# Patient Record
Sex: Female | Born: 1960 | Race: White | Hispanic: No | Marital: Married | Smoking: Never smoker
Health system: Southern US, Community
[De-identification: ages and names within clinical notes are randomized; demographics above are authoritative.]

## PROBLEM LIST (undated history)

## (undated) DIAGNOSIS — E785 Hyperlipidemia, unspecified: Secondary | ICD-10-CM

## (undated) DIAGNOSIS — Z860101 Personal history of adenomatous and serrated colon polyps: Secondary | ICD-10-CM

## (undated) DIAGNOSIS — C801 Malignant (primary) neoplasm, unspecified: Secondary | ICD-10-CM

## (undated) DIAGNOSIS — Z86010 Personal history of colonic polyps: Secondary | ICD-10-CM

## (undated) DIAGNOSIS — I8390 Asymptomatic varicose veins of unspecified lower extremity: Secondary | ICD-10-CM

## (undated) DIAGNOSIS — N189 Chronic kidney disease, unspecified: Secondary | ICD-10-CM

## (undated) DIAGNOSIS — I493 Ventricular premature depolarization: Secondary | ICD-10-CM

## (undated) HISTORY — DX: Ventricular premature depolarization: I49.3

## (undated) HISTORY — DX: Chronic kidney disease, unspecified: N18.9

## (undated) HISTORY — PX: BREAST BIOPSY: SHX20

## (undated) HISTORY — DX: Malignant (primary) neoplasm, unspecified: C80.1

## (undated) HISTORY — PX: TONSILLECTOMY: SUR1361

## (undated) HISTORY — DX: Personal history of colonic polyps: Z86.010

## (undated) HISTORY — PX: COLONOSCOPY W/ POLYPECTOMY: SHX1380

## (undated) HISTORY — DX: Personal history of adenomatous and serrated colon polyps: Z86.0101

## (undated) HISTORY — DX: Asymptomatic varicose veins of unspecified lower extremity: I83.90

## (undated) HISTORY — DX: Hyperlipidemia, unspecified: E78.5

---

## 1997-10-18 ENCOUNTER — Other Ambulatory Visit: Admission: RE | Admit: 1997-10-18 | Discharge: 1997-10-18 | Payer: Self-pay | Admitting: Obstetrics & Gynecology

## 1998-10-19 ENCOUNTER — Other Ambulatory Visit: Admission: RE | Admit: 1998-10-19 | Discharge: 1998-10-19 | Payer: Self-pay | Admitting: Obstetrics & Gynecology

## 2000-03-14 ENCOUNTER — Other Ambulatory Visit: Admission: RE | Admit: 2000-03-14 | Discharge: 2000-03-14 | Payer: Self-pay | Admitting: Obstetrics & Gynecology

## 2001-03-11 ENCOUNTER — Other Ambulatory Visit: Admission: RE | Admit: 2001-03-11 | Discharge: 2001-03-11 | Payer: Self-pay | Admitting: Obstetrics & Gynecology

## 2003-05-26 ENCOUNTER — Other Ambulatory Visit: Admission: RE | Admit: 2003-05-26 | Discharge: 2003-05-26 | Payer: Self-pay | Admitting: Obstetrics & Gynecology

## 2004-05-26 ENCOUNTER — Other Ambulatory Visit: Admission: RE | Admit: 2004-05-26 | Discharge: 2004-05-26 | Payer: Self-pay | Admitting: Obstetrics & Gynecology

## 2005-08-10 ENCOUNTER — Other Ambulatory Visit: Admission: RE | Admit: 2005-08-10 | Discharge: 2005-08-10 | Payer: Self-pay | Admitting: Obstetrics & Gynecology

## 2009-06-22 ENCOUNTER — Encounter: Payer: Self-pay | Admitting: Internal Medicine

## 2009-06-27 ENCOUNTER — Encounter: Payer: Self-pay | Admitting: Internal Medicine

## 2009-06-29 ENCOUNTER — Ambulatory Visit: Payer: Self-pay | Admitting: Internal Medicine

## 2009-06-29 DIAGNOSIS — K589 Irritable bowel syndrome without diarrhea: Secondary | ICD-10-CM | POA: Insufficient documentation

## 2009-06-29 DIAGNOSIS — R0789 Other chest pain: Secondary | ICD-10-CM | POA: Insufficient documentation

## 2009-06-29 DIAGNOSIS — R519 Headache, unspecified: Secondary | ICD-10-CM | POA: Insufficient documentation

## 2009-06-29 DIAGNOSIS — I498 Other specified cardiac arrhythmias: Secondary | ICD-10-CM | POA: Insufficient documentation

## 2009-06-29 DIAGNOSIS — N926 Irregular menstruation, unspecified: Secondary | ICD-10-CM

## 2009-06-29 DIAGNOSIS — R002 Palpitations: Secondary | ICD-10-CM | POA: Insufficient documentation

## 2009-06-29 DIAGNOSIS — R42 Dizziness and giddiness: Secondary | ICD-10-CM | POA: Insufficient documentation

## 2009-06-29 DIAGNOSIS — R51 Headache: Secondary | ICD-10-CM | POA: Insufficient documentation

## 2009-07-11 ENCOUNTER — Ambulatory Visit: Payer: Self-pay | Admitting: Internal Medicine

## 2009-07-27 ENCOUNTER — Ambulatory Visit: Payer: Self-pay

## 2009-07-27 ENCOUNTER — Ambulatory Visit: Payer: Self-pay | Admitting: Internal Medicine

## 2009-07-27 ENCOUNTER — Ambulatory Visit: Payer: Self-pay | Admitting: Cardiovascular Disease

## 2009-08-10 ENCOUNTER — Ambulatory Visit: Payer: Self-pay | Admitting: Cardiology

## 2009-08-10 ENCOUNTER — Ambulatory Visit (HOSPITAL_COMMUNITY): Admission: RE | Admit: 2009-08-10 | Discharge: 2009-08-10 | Payer: Self-pay | Admitting: Internal Medicine

## 2009-08-10 ENCOUNTER — Ambulatory Visit: Payer: Self-pay

## 2009-08-10 ENCOUNTER — Encounter: Payer: Self-pay | Admitting: Internal Medicine

## 2013-03-09 ENCOUNTER — Encounter: Payer: Self-pay | Admitting: Internal Medicine

## 2013-04-14 ENCOUNTER — Encounter (HOSPITAL_COMMUNITY): Payer: Self-pay | Admitting: Emergency Medicine

## 2013-04-14 ENCOUNTER — Emergency Department (HOSPITAL_COMMUNITY): Payer: BC Managed Care – PPO

## 2013-04-14 ENCOUNTER — Emergency Department (HOSPITAL_COMMUNITY)
Admission: EM | Admit: 2013-04-14 | Discharge: 2013-04-14 | Disposition: A | Discharge status: Home / Self Care | Payer: BC Managed Care – PPO | Attending: Emergency Medicine | Admitting: Emergency Medicine

## 2013-04-14 DIAGNOSIS — S46909A Unspecified injury of unspecified muscle, fascia and tendon at shoulder and upper arm level, unspecified arm, initial encounter: Secondary | ICD-10-CM | POA: Insufficient documentation

## 2013-04-14 DIAGNOSIS — S0003XA Contusion of scalp, initial encounter: Secondary | ICD-10-CM | POA: Insufficient documentation

## 2013-04-14 DIAGNOSIS — W1809XA Striking against other object with subsequent fall, initial encounter: Secondary | ICD-10-CM | POA: Insufficient documentation

## 2013-04-14 DIAGNOSIS — Z79899 Other long term (current) drug therapy: Secondary | ICD-10-CM | POA: Insufficient documentation

## 2013-04-14 DIAGNOSIS — S79919A Unspecified injury of unspecified hip, initial encounter: Secondary | ICD-10-CM | POA: Insufficient documentation

## 2013-04-14 DIAGNOSIS — Y9229 Other specified public building as the place of occurrence of the external cause: Secondary | ICD-10-CM | POA: Insufficient documentation

## 2013-04-14 DIAGNOSIS — W102XXA Fall (on)(from) incline, initial encounter: Secondary | ICD-10-CM

## 2013-04-14 DIAGNOSIS — T148XXA Other injury of unspecified body region, initial encounter: Secondary | ICD-10-CM

## 2013-04-14 DIAGNOSIS — Y9301 Activity, walking, marching and hiking: Secondary | ICD-10-CM | POA: Insufficient documentation

## 2013-04-14 DIAGNOSIS — W108XXA Fall (on) (from) other stairs and steps, initial encounter: Secondary | ICD-10-CM | POA: Insufficient documentation

## 2013-04-14 DIAGNOSIS — S0093XA Contusion of unspecified part of head, initial encounter: Secondary | ICD-10-CM

## 2013-04-14 DIAGNOSIS — S4980XA Other specified injuries of shoulder and upper arm, unspecified arm, initial encounter: Secondary | ICD-10-CM | POA: Insufficient documentation

## 2013-04-14 MED ORDER — IBUPROFEN 600 MG PO TABS: 600.0000 mg | ORAL_TABLET | Freq: Four times a day (QID) | ORAL | Status: AC | PRN

## 2013-04-14 MED ORDER — ONDANSETRON 4 MG PO TBDP
4.0000 mg | ORAL_TABLET | Freq: Once | ORAL | Status: AC
  Administered 2013-04-14: 4 mg via ORAL
  Filled 2013-04-14: qty 1

## 2013-04-14 MED ORDER — OXYCODONE-ACETAMINOPHEN 5-325 MG PO TABS
1.0000 | ORAL_TABLET | Freq: Once | ORAL | Status: AC
  Administered 2013-04-14: 1 via ORAL
  Filled 2013-04-14: qty 1

## 2013-04-14 MED ORDER — OXYCODONE-ACETAMINOPHEN 5-325 MG PO TABS: 1.0000 | ORAL_TABLET | Freq: Four times a day (QID) | ORAL | Status: DC | PRN

## 2013-04-14 NOTE — Progress Notes (Signed)
Patient reports she does not have a pcp.  Patient confirms she has a GYN doctor at [facility], Dr. Varney Baas.  [facility] instructed patient to call the phone number on the back of her insurance card or go to insurance company website to help her find a physician who is close to her and within network.  Patient verbalized understanding.  No further needs at this time.

## 2013-04-14 NOTE — ED Provider Notes (Signed)
CSN: [id number]     Arrival date & time 04/14/13  1536 History   First MD Initiated Contact with Patient 04/14/13 1549     Chief Complaint  Patient presents with  . Fall  . Head Injury  . Neck Pain  . Shoulder Pain    left  . Hip Pain    left   (Consider location/radiation/quality/duration/timing/severity/associated sxs/prior Treatment) HPI  This is a 52 year old female who presents following a fall. The patient reports falling down several steps while at church today. Patient reports hitting her head and her bottom. She slipped on the steps. She denies any loss of consciousness. Patient reports headache, neck pain, left shoulder pain, and left hip pain. She has been angulatory. She's not on any anticoagulants.  History reviewed. No pertinent past medical history. Past Surgical History  Procedure Laterality Date  . Tonsillectomy     No family history on file. History  Substance Use Topics  . Smoking status: Never Smoker   . Smokeless tobacco: Not on file  . Alcohol Use: 0.6 oz/week    1 Glasses of wine per week   OB History   Grav Para Term Preterm Abortions TAB SAB Ect Mult Living                 Review of Systems  Constitutional: Negative for fever.  HENT: Positive for neck pain.   Respiratory: Negative for cough, chest tightness and shortness of breath.   Cardiovascular: Negative for chest pain.  Gastrointestinal: Positive for nausea. Negative for vomiting and abdominal pain.  Genitourinary: Negative for dysuria.  Musculoskeletal: Negative for back pain and gait problem.  Skin: Negative for wound.  Neurological: Positive for headaches. Negative for dizziness and syncope.  Psychiatric/Behavioral: Negative for confusion.  All other systems reviewed and are negative.    Allergies  Erythromycin  Home Medications   Current Outpatient Rx  Name  Route  Sig  Dispense  Refill  . cetirizine (ZYRTEC) 10 MG tablet   Oral   Take 10 mg by mouth daily as needed for  allergies.         Marland Kitchen estradiol (VIVELLE-DOT) 0.1 MG/24HR patch   Transdermal   Place 1 patch onto the skin 2 (two) times a week. Monday and Friday.         . naproxen sodium (ANAPROX) 220 MG tablet   Oral   Take 440 mg by mouth 2 (two) times daily as needed (for pain).         . pseudoephedrine (SUDAFED) 30 MG tablet   Oral   Take 30 mg by mouth every 4 (four) hours as needed for congestion.         Marland Kitchen ibuprofen (ADVIL,MOTRIN) 600 MG tablet   Oral   Take 1 tablet (600 mg total) by mouth every 6 (six) hours as needed for pain.   30 tablet   0   . oxyCODONE-acetaminophen (PERCOCET/ROXICET) 5-325 MG per tablet   Oral   Take 1 tablet by mouth every 6 (six) hours as needed for pain.   6 tablet   0    BP 126/80  Pulse 50  Resp 20  Ht 5' 4.5" (1.638 m)  Wt 145 lb (65.772 kg)  BMI 24.51 kg/m2  SpO2 100% Physical Exam  Nursing note and vitals reviewed. Constitutional: She is oriented to person, place, and time. She appears well-developed and well-nourished. No distress.  HENT:  Head: Normocephalic.  Quarter-sized hematoma over the left occiput without laceration  Eyes:  EOM are normal. Pupils are equal, round, and reactive to light.  Neck: Neck supple.  No C-spine tenderness, full range of motion  Cardiovascular: Normal rate, regular rhythm and normal heart sounds.   Pulmonary/Chest: Effort normal and breath sounds normal. No respiratory distress. She has no wheezes. She exhibits no tenderness.  Abdominal: Soft. Bowel sounds are normal. She exhibits no distension. There is no tenderness.  Musculoskeletal: She exhibits no edema.  Full range of motion of the left shoulder and left hip. No tenderness to palpation. Small ecchymosis noted over the left lateral hip. There is no midline tenderness palpation of the thoracic or lumbar spine  Neurological: She is alert and oriented to person, place, and time. She has normal reflexes.  Skin: Skin is warm and dry.  Psychiatric: She  has a normal mood and affect.    ED Course  Procedures (including critical care time) Labs Review Labs Reviewed - No data to display Imaging Review Dg Pelvis 1-2 Views  04/14/2013   CLINICAL DATA:  Fall. Left hip pain.  EXAM: PELVIS - 1-2 VIEW  COMPARISON:  None.  FINDINGS: No acute fractures involving the pelvis. Well preserved joint spaces in both hips. Sacroiliac joints intact. Symphysis pubis intact with mild degenerative changes. Visualized lower lumbar spine intact.  IMPRESSION: No acute osseous abnormality. Mild degenerative changes involving the symphysis pubis.   Electronically Signed   By: Hulan Saas   On: 04/14/2013 17:04   Ct Head Wo Contrast  04/14/2013   CLINICAL DATA:  Status post fall with head injury  EXAM: CT HEAD WITHOUT CONTRAST  TECHNIQUE: Contiguous axial images were obtained from the base of the skull through the vertex without intravenous contrast.  COMPARISON:  None.  FINDINGS: There is no midline shift, hydrocephalus, or mass. No acute hemorrhage or acute transcortical infarct is identified. The bony calvarium is intact. The visualized sinuses are clear.  IMPRESSION: No focal acute intracranial abnormality identified.   Electronically Signed   By: Sherian Rein   On: 04/14/2013 16:49   Dg Shoulder Left  04/14/2013   CLINICAL DATA:  Larey Seat and injured left shoulder.  EXAM: LEFT SHOULDER - 2+ VIEW  COMPARISON:  None.  FINDINGS: No evidence of acute fracture or glenohumeral dislocation. Tiny fleck of calcium in the supraspinatus tendon at its insertion on the greater tuberosity. Moderate to severe degenerative changes in the acromioclavicular joint and the sternoclavicular joint. Subacromial space well preserved.  IMPRESSION: No acute osseous abnormality. Mild chronic calcific supraspinatus tendinitis. Moderate to severe degenerative changes in the [facility] joint and the sternoclavicular joint.   Electronically Signed   By: Hulan Saas   On: 04/14/2013 17:03    MDM   1.  Fall (on)(from) incline, initial encounter   2. Contusion   3. Traumatic hematoma of head, initial encounter    This is a 64 are old female who presents following a fall. She is nontoxic-appearing. She has a small ecchymosis over her left hip with full range of motion. I discussed with the patient that her likelihood of intracranial abnormality is low given that she's not on any current anticoagulants and did not lose consciousness. However, the patient is concerned about her persistent headache. She was given Percocet. CT of the head was obtained and is negative. Plain films were also obtained and are negative for acute injury. Patient ambulated and was able to tolerate by mouth prior to discharge.  After history, exam, and medical workup I feel the patient has been appropriately  medically screened and is safe for discharge home. Pertinent diagnoses were discussed with the patient. Patient was given return precautions.     Shon Baton, MD 04/15/13 [phone]

## 2013-04-14 NOTE — ED Notes (Signed)
Ambulate pt - steady and stong

## 2013-04-14 NOTE — ED Notes (Signed)
Pt states she was walking down wet stairs and fell landing on her bottom then hit her head on the stairs.  Pt denies being on any blood thinners or LOC.  Pt c/o head pain, neck pain, left shoulder and left hip pain.

## 2013-07-27 ENCOUNTER — Other Ambulatory Visit: Payer: Self-pay | Admitting: *Deleted

## 2013-07-27 DIAGNOSIS — I83893 Varicose veins of bilateral lower extremities with other complications: Secondary | ICD-10-CM

## 2013-07-28 ENCOUNTER — Encounter: Payer: BC Managed Care – PPO | Admitting: Vascular Surgery

## 2013-07-28 ENCOUNTER — Encounter (HOSPITAL_COMMUNITY): Payer: BC Managed Care – PPO

## 2013-08-10 ENCOUNTER — Encounter: Payer: Self-pay | Admitting: Vascular Surgery

## 2013-08-11 ENCOUNTER — Encounter (INDEPENDENT_AMBULATORY_CARE_PROVIDER_SITE_OTHER): Payer: Self-pay

## 2013-08-11 ENCOUNTER — Ambulatory Visit (INDEPENDENT_AMBULATORY_CARE_PROVIDER_SITE_OTHER): Payer: Self-pay | Admitting: Vascular Surgery

## 2013-08-11 ENCOUNTER — Ambulatory Visit (HOSPITAL_COMMUNITY)
Admission: RE | Admit: 2013-08-11 | Discharge: 2013-08-11 | Disposition: A | Discharge status: Home / Self Care | Payer: BC Managed Care – PPO | Source: Ambulatory Visit | Attending: Vascular Surgery | Admitting: Vascular Surgery

## 2013-08-11 ENCOUNTER — Encounter: Payer: Self-pay | Admitting: Vascular Surgery

## 2013-08-11 VITALS — BP 148/69 | HR 61 | Resp 16 | Ht 64.5 in | Wt 145.0 lb

## 2013-08-11 DIAGNOSIS — I83893 Varicose veins of bilateral lower extremities with other complications: Secondary | ICD-10-CM | POA: Insufficient documentation

## 2013-08-11 NOTE — Progress Notes (Signed)
Subjective:     Patient ID: Dana Herrera, female   DOB: Sep 19, 1960, 53 y.o.   MRN: [id number]  HPI this 53 year old female evaluated for varicose veins in the right leg worse than the left leg. She has noted these varicosities over the last several years. They have become more prominent and become more symptomatic with aching throbbing and burning discomfort as the day progresses. She does not wear elastic compression stockings nor elevate her legs. She has no history of DVT or thrombophlebitis stasis ulcers or bleeding. She does develop some swelling in both legs as the day progresses.  Past Medical History  Diagnosis Date  . Cancer     skin  . Chronic kidney disease     stone    History  Substance Use Topics  . Smoking status: Never Smoker   . Smokeless tobacco: Not on file  . Alcohol Use: 0.6 oz/week    1 Glasses of wine per week    Family History  Problem Relation Age of Onset  . Cancer Mother   . Hyperlipidemia Mother   . Heart disease Mother   . Stroke Mother   . Cancer Father   . Diabetes Father   . Heart disease Father   . Hypertension Father   . Cancer Brother   . Hyperlipidemia Brother   . Hypertension Brother     Allergies  Allergen Reactions  . Erythromycin Nausea And Vomiting    Current outpatient prescriptions:estradiol (VIVELLE-DOT) 0.1 MG/24HR patch, Place 1 patch onto the skin 2 (two) times a week. Monday and Friday., Disp: , Rfl: ;  ibuprofen (ADVIL,MOTRIN) 600 MG tablet, Take 1 tablet (600 mg total) by mouth every 6 (six) hours as needed for pain., Disp: 30 tablet, Rfl: 0;  loratadine (CLARITIN) 10 MG tablet, Take 10 mg by mouth daily., Disp: , Rfl:  naproxen sodium (ANAPROX) 220 MG tablet, Take 440 mg by mouth 2 (two) times daily as needed (for pain)., Disp: , Rfl: ;  pseudoephedrine (SUDAFED) 30 MG tablet, Take 30 mg by mouth every 4 (four) hours as needed for congestion., Disp: , Rfl: ;  cetirizine (ZYRTEC) 10 MG tablet, Take 10 mg by mouth daily as  needed for allergies., Disp: , Rfl:  oxyCODONE-acetaminophen (PERCOCET/ROXICET) 5-325 MG per tablet, Take 1 tablet by mouth every 6 (six) hours as needed for pain., Disp: 6 tablet, Rfl: 0  BP 148/69  Pulse 61  Resp 16  Ht 5' 4.5" (1.638 m)  Wt 145 lb (65.772 kg)  BMI 24.51 kg/m2  Body mass index is 24.51 kg/(m^2).          Review of Systems denies chest pain, dyspnea on exertion, PND, orthopnea. Complains of leg pain with ambulation, history of kidney stones. Other systems negative complete review of systems     Objective:   Physical Exam BP 148/69  Pulse 61  Resp 16  Ht 5' 4.5" (1.638 m)  Wt 145 lb (65.772 kg)  BMI 24.51 kg/m2  Gen.-alert and oriented x3 in no apparent distress HEENT normal for age Lungs no rhonchi or wheezing Cardiovascular regular rhythm no murmurs carotid pulses 3+ palpable no bruits audible Abdomen soft nontender no palpable masses Musculoskeletal free of  major deformities Skin clear -no rashes Neurologic normal Lower extremities 3+ femoral and dorsalis pedis pulses palpable bilaterally with no edema Right leg with large nest of bulging varicosities in the distal medial thigh and medial calf extending posteriorly of the great saphenous system. 1+ edema distally. No hyperpigmentation ulceration  noted. Left leg with varicosities in the left medial calf which are less prominent over the great saphenous system.  Today I ordered a venous duplex exam of the right leg which are reviewed and interpreted. There is gross reflux throughout the right great saphenous system supplying these bulging varicosities and no DVT. Right small saphenous is free reflux.      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

## 2013-08-11 NOTE — Addendum Note (Signed)
Addended by: Mena Goes on: 08/11/2013 03:47 PM   Modules accepted: Orders

## 2013-08-27 ENCOUNTER — Encounter: Payer: Self-pay | Admitting: Internal Medicine

## 2013-10-20 ENCOUNTER — Ambulatory Visit (AMBULATORY_SURGERY_CENTER): Payer: Self-pay

## 2013-10-20 VITALS — Ht 64.5 in | Wt 151.0 lb

## 2013-10-20 DIAGNOSIS — Z1211 Encounter for screening for malignant neoplasm of colon: Secondary | ICD-10-CM

## 2013-10-20 MED ORDER — MOVIPREP 100 G PO SOLR: 1.0000 | Freq: Once | ORAL | Status: DC

## 2013-10-23 ENCOUNTER — Encounter: Payer: Self-pay | Admitting: Internal Medicine

## 2013-10-30 ENCOUNTER — Ambulatory Visit (AMBULATORY_SURGERY_CENTER): Payer: BC Managed Care – PPO | Admitting: Internal Medicine

## 2013-10-30 ENCOUNTER — Encounter: Payer: Self-pay | Admitting: Internal Medicine

## 2013-10-30 VITALS — BP 99/75 | HR 54 | Temp 98.5°F | Resp 14 | Ht 64.0 in | Wt 151.0 lb

## 2013-10-30 DIAGNOSIS — D126 Benign neoplasm of colon, unspecified: Secondary | ICD-10-CM

## 2013-10-30 DIAGNOSIS — Z1211 Encounter for screening for malignant neoplasm of colon: Secondary | ICD-10-CM

## 2013-10-30 MED ORDER — SODIUM CHLORIDE 0.9 % IV SOLN: 500.0000 mL | INTRAVENOUS | Status: DC

## 2013-10-30 NOTE — Progress Notes (Signed)
Called to room to assist during endoscopic procedure.  Patient ID and intended procedure confirmed with present staff. Received instructions for my participation in the procedure from the performing physician.  

## 2013-10-30 NOTE — Patient Instructions (Signed)
YOU HAD AN ENDOSCOPIC PROCEDURE TODAY AT THE [facility]: Refer to the procedure report that was given to you for any specific questions about what was found during the examination.  If the procedure report does not answer your questions, please call your gastroenterologist to clarify.  If you requested that your care partner not be given the details of your procedure findings, then the procedure report has been included in a sealed envelope for you to review at your convenience later.  YOU SHOULD EXPECT: Some feelings of bloating in the abdomen. Passage of more gas than usual.  Walking can help get rid of the air that was put into your GI tract during the procedure and reduce the bloating. If you had a lower endoscopy (such as a colonoscopy or flexible sigmoidoscopy) you may notice spotting of blood in your stool or on the toilet paper. If you underwent a bowel prep for your procedure, then you may not have a normal bowel movement for a few days.  DIET: Your first meal following the procedure should be a light meal and then it is ok to progress to your normal diet.  A half-sandwich or bowl of soup is an example of a good first meal.  Heavy or fried foods are harder to digest and may make you feel nauseous or bloated.  Likewise meals heavy in dairy and vegetables can cause extra gas to form and this can also increase the bloating.  Drink plenty of fluids but you should avoid alcoholic beverages for 24 hours.  ACTIVITY: Your care partner should take you home directly after the procedure.  You should plan to take it easy, moving slowly for the rest of the day.  You can resume normal activity the day after the procedure however you should NOT DRIVE or use heavy machinery for 24 hours (because of the sedation medicines used during the test).    SYMPTOMS TO REPORT IMMEDIATELY: A gastroenterologist can be reached at any hour.  During normal business hours, 8:30 AM to 5:00 PM Monday through Friday,  call [phone].  After hours and on weekends, please call the GI answering service at [phone] who will take a message and have the physician on call contact you.   Following lower endoscopy (colonoscopy or flexible sigmoidoscopy):  Excessive amounts of blood in the stool  Significant tenderness or worsening of abdominal pains  Swelling of the abdomen that is new, acute  Fever of 100F or higher  FOLLOW UP: If any biopsies were taken you will be contacted by phone or by letter within the next 1-3 weeks.  Call your gastroenterologist if you have not heard about the biopsies in 3 weeks.  Our staff will call the home number listed on your records the next business day following your procedure to check on you and address any questions or concerns that you may have at that time regarding the information given to you following your procedure. This is a courtesy call and so if there is no answer at the home number and we have not heard from you through the emergency physician on call, we will assume that you have returned to your regular daily activities without incident.  SIGNATURES/CONFIDENTIALITY: You and/or your care partner have signed paperwork which will be entered into your electronic medical record.  These signatures attest to the fact that that the information above on your After Visit Summary has been reviewed and is understood.  Full responsibility of the confidentiality of this   discharge information lies with you and/or your care-partner.  Polyps and high fiber diet.

## 2013-10-30 NOTE — Progress Notes (Signed)
A/ox3 pleased with MAC, report to Jane RN 

## 2013-10-30 NOTE — Op Note (Signed)
[place]  [address] [place], [address]   COLONOSCOPY PROCEDURE REPORT  PATIENT: Valoree, Agent  MR#: [id number] BIRTHDATE: [birth year] , 25  yrs. old GENDER: Female ENDOSCOPIST: Lafayette Dragon, MD REFERRED IR:SWNIOE Nori Riis, M.D. PROCEDURE DATE:  10/30/2013 PROCEDURE:   Colonoscopy with cold biopsy polypectomy First Screening Colonoscopy - Avg.  risk and is 50 yrs.  old or older - No.  Prior Negative Screening - Now for repeat screening. 10 or more years since last screening  History of Adenoma - Now for follow-up colonoscopy & has been > or = to 3 yrs.  N/A  Polyps Removed Today? Yes. ASA CLASS:   Class I INDICATIONS:Average risk patient for colon cancer. , last colonoscopy in 2004 MEDICATIONS: MAC sedation, administered by CRNA and Propofol (Diprivan) 360 mg IV  DESCRIPTION OF PROCEDURE:   After the risks benefits and alternatives of the procedure were thoroughly explained, informed consent was obtained.  A digital rectal exam revealed no abnormalities of the rectum.   The LB [id number]  endoscope was introduced through the anus and advanced to the cecum, which was identified by both the appendix and ileocecal valve. No adverse events experienced.   The quality of the prep was good, using MoviPrep  The instrument was then slowly withdrawn as the colon was fully examined.      COLON FINDINGS: Two sessile polyps ranging between 3-26mm in size were found in the ascending colon.  A polypectomy was performed with cold forceps and with a cold snare.  The resection was complete and the polyp tissue was completely retrieved.  A polypectomy was performed with cold forceps and with a cold snare. Retroflexed views revealed no abnormalities. The time to cecum=8 minutes 45 seconds.  Withdrawal time=13 minutes 520 seconds.  The scope was withdrawn and the procedure completed. COMPLICATIONS: There were no complications.  ENDOSCOPIC IMPRESSION: Two  sessile polyps ranging between 3-12mm in size were found in the ascending colon; polypectomy was performed with cold forceps and with a cold snare;  RECOMMENDATIONS: 1.  Await biopsy results 2.  high fiber diet Recall colonoscopy pending path report   eSigned:  Lafayette Dragon, MD 10/30/2013 12:00 PM   cc:   PATIENT NAME:  Trenice, [place] MR#: [id number]

## 2013-11-02 ENCOUNTER — Telehealth: Payer: Self-pay | Admitting: *Deleted

## 2013-11-02 NOTE — Telephone Encounter (Signed)
  Follow up Call-  Call back number 10/30/2013  Post procedure Call Back phone  # [phone]  Permission to leave phone message Yes    No answer, left message to call if questions or concerns.

## 2013-11-03 ENCOUNTER — Encounter: Payer: Self-pay | Admitting: Internal Medicine

## 2013-11-12 ENCOUNTER — Ambulatory Visit: Payer: BC Managed Care – PPO | Admitting: Vascular Surgery

## 2013-11-17 ENCOUNTER — Ambulatory Visit: Payer: BC Managed Care – PPO | Admitting: Vascular Surgery

## 2013-11-17 ENCOUNTER — Encounter (HOSPITAL_COMMUNITY): Payer: BC Managed Care – PPO

## 2013-11-20 ENCOUNTER — Encounter: Payer: Self-pay | Admitting: Vascular Surgery

## 2013-11-20 ENCOUNTER — Other Ambulatory Visit: Payer: Self-pay

## 2013-11-23 ENCOUNTER — Encounter: Payer: Self-pay | Admitting: Vascular Surgery

## 2013-11-23 ENCOUNTER — Ambulatory Visit (INDEPENDENT_AMBULATORY_CARE_PROVIDER_SITE_OTHER): Payer: BC Managed Care – PPO | Admitting: Vascular Surgery

## 2013-11-23 VITALS — BP 129/81 | HR 72 | Resp 16 | Ht 64.5 in | Wt 148.0 lb

## 2013-11-23 DIAGNOSIS — I83893 Varicose veins of bilateral lower extremities with other complications: Secondary | ICD-10-CM

## 2013-11-23 NOTE — Progress Notes (Signed)
Subjective:     Patient ID: Dana Herrera, female   DOB: Jan 10, 1961, 53 y.o.   MRN: [id number]  HPI 19 -year-old female returns for further followup regarding her bulging painful varicosities in the right lower extremity. These extend from the thigh in the medial and lateral caffeine cause edema as the day progresses. She has tried long-leg elastic compression stockings 20-30 mm gradient as well as elevation and ibuprofen with no improvement. These are definitely affecting her daily living. She has no history of DVT.  Past Medical History  Diagnosis Date  . Cancer     skin  . Chronic kidney disease     stone  . PVC's (premature ventricular contractions)     History  Substance Use Topics  . Smoking status: Never Smoker   . Smokeless tobacco: Not on file  . Alcohol Use: 0.6 oz/week    1 Glasses of wine per week    Family History  Problem Relation Age of Onset  . Cancer Mother   . Hyperlipidemia Mother   . Heart disease Mother   . Stroke Mother   . Cancer Father   . Diabetes Father   . Heart disease Father   . Hypertension Father   . Cancer Brother   . Hyperlipidemia Brother   . Hypertension Brother   . Colon cancer Neg Hx   . Pancreatic cancer Neg Hx   . Stomach cancer Neg Hx     Allergies  Allergen Reactions  . Erythromycin Nausea And Vomiting    Current outpatient prescriptions:estradiol (VIVELLE-DOT) 0.1 MG/24HR patch, Place 1 patch onto the skin 2 (two) times a week. Monday and Friday., Disp: , Rfl: ;  ibuprofen (ADVIL,MOTRIN) 600 MG tablet, Take 1 tablet (600 mg total) by mouth every 6 (six) hours as needed for pain., Disp: 30 tablet, Rfl: 0;  loratadine (CLARITIN) 10 MG tablet, Take 10 mg by mouth daily., Disp: , Rfl:  naproxen sodium (ANAPROX) 220 MG tablet, Take 440 mg by mouth 2 (two) times daily as needed (for pain)., Disp: , Rfl: ;  OVER THE COUNTER MEDICATION, FiberCon every pm (sometimes bid), Disp: , Rfl: ;  OVER THE COUNTER MEDICATION, Stool softner prn,  Disp: , Rfl: ;  progesterone (PROMETRIUM) 100 MG capsule, Take 100 mg by mouth every 3 (three) months., Disp: , Rfl:  pseudoephedrine (SUDAFED) 30 MG tablet, Take 30 mg by mouth every 4 (four) hours as needed for congestion., Disp: , Rfl:   BP 129/81  Pulse 72  Resp 16  Ht 5' 4.5" (1.638 m)  Wt 148 lb (67.132 kg)  BMI 25.02 kg/m2  Body mass index is 25.02 kg/(m^2).           Review of Systems denies chest pain, dyspnea on exertion, PND, orthopnea, hemoptysis.    Objective:   Physical Exam BP 129/81  Pulse 72  Resp 16  Ht 5' 4.5" (1.638 m)  Wt 148 lb (67.132 kg)  BMI 25.02 kg/m2  General well-developed well-nourished female no apparent stress alert and oriented x3 Lungs no rhonchi or wheezing Right leg with bulging varicosities in the medial distal thigh and medial calf extending in the pretibial and lateral calf regions 1+ edema. No active ulcers noted. 3+ posterior tibial  pulse palpable.       Assessment:     Painful varicosities right leg secondary to gross reflux right great saphenous vein which are quite symptomatic and resistant to conservative measures i.e. long-leg elastic compression stockings elevation and ibuprofen. Affecting  patient's daily living.    Plan:     Patient needs laser ablation right great saphenous vein with greater than 20 stab phlebectomy a painful varicosities. Proceed with precertification to perform this in the near future

## 2013-11-24 ENCOUNTER — Other Ambulatory Visit: Payer: Self-pay | Admitting: *Deleted

## 2013-11-24 DIAGNOSIS — I83893 Varicose veins of bilateral lower extremities with other complications: Secondary | ICD-10-CM

## 2013-12-01 ENCOUNTER — Ambulatory Visit: Payer: BC Managed Care – PPO | Admitting: Vascular Surgery

## 2013-12-04 ENCOUNTER — Encounter: Payer: Self-pay | Admitting: Vascular Surgery

## 2013-12-07 ENCOUNTER — Ambulatory Visit (INDEPENDENT_AMBULATORY_CARE_PROVIDER_SITE_OTHER): Payer: Self-pay | Admitting: Vascular Surgery

## 2013-12-07 ENCOUNTER — Encounter: Payer: Self-pay | Admitting: Vascular Surgery

## 2013-12-07 VITALS — BP 122/76 | HR 74 | Resp 16 | Ht 64.5 in | Wt 143.0 lb

## 2013-12-07 DIAGNOSIS — I83893 Varicose veins of bilateral lower extremities with other complications: Secondary | ICD-10-CM

## 2013-12-07 NOTE — Progress Notes (Signed)
   Laser Ablation Procedure      Date: 12/07/2013    Dana Herrera DOB:28-Feb-1961  Consent signed: Yes  Surgeon:J.D. Kellie Simmering  Procedure: Laser Ablation: right Greater Saphenous Vein  BP 122/76  Pulse 74  Resp 16  Ht 5' 4.5" (1.638 m)  Wt 143 lb (64.864 kg)  BMI 24.18 kg/m2  Start time: 9:25   End time: 10:35  Tumescent Anesthesia: 400 cc 0.9% NaCl with 50 cc Lidocaine HCL with 1% Epi and 15 cc 8.4% NaHCO3  Local Anesthesia: 9 cc Lidocaine HCL and NaHCO3 (ratio 2:1)  Pulsed mode: `15 watts, 523ms delay, 1.0 duration Total energy: 1361, total pulses: 91, total time: 1:30    Stab Phlebectomy: >20 Sites: Thigh and Calf  Patient tolerated procedure well: Yes  Notes:   Description of Procedure:  After marking the course of the saphenous vein and the secondary varicosities in the standing position, the patient was placed on the operating table in the supine position, and the right leg was prepped and draped in sterile fashion. Local anesthetic was administered, and under ultrasound guidance the saphenous vein was accessed with a micro needle and guide wire; then the micro puncture sheath was placed. A guide wire was inserted to the saphenofemoral junction, followed by a 5 french sheath.  The position of the sheath and then the laser fiber below the junction was confirmed using the ultrasound and visualization of the aiming beam.  Tumescent anesthesia was administered along the course of the saphenous vein using ultrasound guidance. Protective laser glasses were placed on the patient, and the laser was fired at 15 watt pulsed mode advancing 1-2 mm per sec.  For a total of 1361 joules.  A steri strip was applied to the puncture site.  The patient was then put into Trendelenburg position.  Local anesthetic was utilized overlying the marked varicosities.  Greater than 20 stab wounds were made using the tip of an 11 blade; and using the vein hook,  The phlebectomies were performed using a  hemostat to avulse these varicosities.  Adequate hemostasis was achieved, and steri strips were applied to the stab wound.      ABD pads and thigh high compression stockings were applied.  Ace wrap bandages were applied over the phlebectomy sites and at the top of the saphenofemoral junction.  Blood loss was less than 15 cc.  The patient ambulated out of the operating room having tolerated the procedure well.

## 2013-12-07 NOTE — Progress Notes (Signed)
Subjective:     Patient ID: Dana Herrera, female   DOB: 01-24-1961, 53 y.o.   MRN: [id number]  HPI this 53 year old female had laser ablation right great saphenous vein performed under local tumescent anesthesia with greater than 20 stab phlebectomy of painful varicosities. A total of 1361 J of energy was utilized. She tolerated the procedure well.  Review of Systems     Objective:   Physical Exam BP 122/76  Pulse 74  Resp 16  Ht 5' 4.5" (1.638 m)  Wt 143 lb (64.864 kg)  BMI 24.18 kg/m2       Assessment:     Well-tolerated laser ablation right great saphenous vein with greater than 20 stab phlebectomy of painful varicosities performed under local tumescent anesthesia    Plan:     Return June 1 for a venous duplex exam to confirm closure right great saphenous vein.

## 2013-12-08 ENCOUNTER — Telehealth: Payer: Self-pay | Admitting: *Deleted

## 2013-12-08 NOTE — Telephone Encounter (Signed)
Left message for her to call if she has any concerns or questions.

## 2013-12-09 ENCOUNTER — Other Ambulatory Visit: Payer: Self-pay | Admitting: *Deleted

## 2013-12-09 DIAGNOSIS — I83893 Varicose veins of bilateral lower extremities with other complications: Secondary | ICD-10-CM

## 2013-12-16 ENCOUNTER — Other Ambulatory Visit: Payer: Self-pay | Admitting: Dermatology

## 2013-12-18 ENCOUNTER — Encounter: Payer: Self-pay | Admitting: Vascular Surgery

## 2013-12-21 ENCOUNTER — Encounter (HOSPITAL_COMMUNITY): Payer: BC Managed Care – PPO

## 2013-12-21 ENCOUNTER — Encounter: Payer: Self-pay | Admitting: Vascular Surgery

## 2013-12-21 ENCOUNTER — Ambulatory Visit: Payer: BC Managed Care – PPO | Admitting: Vascular Surgery

## 2013-12-21 ENCOUNTER — Ambulatory Visit (INDEPENDENT_AMBULATORY_CARE_PROVIDER_SITE_OTHER): Payer: Self-pay | Admitting: Vascular Surgery

## 2013-12-21 ENCOUNTER — Ambulatory Visit (HOSPITAL_COMMUNITY)
Admission: RE | Admit: 2013-12-21 | Discharge: 2013-12-21 | Disposition: A | Discharge status: Home / Self Care | Payer: BC Managed Care – PPO | Source: Ambulatory Visit | Attending: Vascular Surgery | Admitting: Vascular Surgery

## 2013-12-21 VITALS — BP 144/84 | HR 59 | Resp 16 | Ht 64.5 in | Wt 145.0 lb

## 2013-12-21 DIAGNOSIS — I83893 Varicose veins of bilateral lower extremities with other complications: Secondary | ICD-10-CM

## 2013-12-21 NOTE — Progress Notes (Signed)
Subjective:     Patient ID: Dana Herrera, female   DOB: [birth year], 53 y.o.   MRN: [id number]  HPI this 53 year old female returns 2 weeks post laser ablation right great saphenous vein greater than 20 stab phlebectomy of painful varicosities. She states she had some aching discomfort in the right side initially which is now resolving. She's had no distal edema. She did take ibuprofen as instructed and were long-leg elastic compression stockings for 2 weeks.  Review of Systems     Objective:   Physical Exam BP 144/84  Pulse 59  Resp 16  Ht 5' 4.5" (1.638 m)  Wt 145 lb (65.772 kg)  BMI 24.51 kg/m2  General well-developed well-nourished female no apparent stress alert and oriented x3 Right leg with mild ecchymosis in mid thigh posteriorly. Minimal tenderness to palpation. No distal edema noted. 3 posterior cells pedis pulse palpable. Stab phlebectomy sites and calf and thigh are healing nicely.  Today our venous duplex exam of the right leg which are reviewed and interpreted. There is no DVT. There is successful ablation of the right great saphenous vein from the distal thigh to near the saphenofemoral junction. Left leg was also examined and there is no significant reflux in the great saphenous system there is some reflux in a branch in the mid calf supplying one isolated varix.     Assessment:     Successful laser ablation right great saphenous vein with multiple stab phlebectomy of painful varicosities    Plan:     Return to see me on a when necessary basis

## 2014-11-04 ENCOUNTER — Other Ambulatory Visit: Payer: Self-pay | Admitting: Obstetrics & Gynecology

## 2014-11-04 DIAGNOSIS — N6320 Unspecified lump in the left breast, unspecified quadrant: Secondary | ICD-10-CM

## 2014-11-05 ENCOUNTER — Other Ambulatory Visit: Payer: Self-pay | Admitting: Obstetrics & Gynecology

## 2014-11-05 ENCOUNTER — Ambulatory Visit
Admission: RE | Admit: 2014-11-05 | Discharge: 2014-11-05 | Disposition: A | Discharge status: Home / Self Care | Payer: BLUE CROSS/BLUE SHIELD | Source: Ambulatory Visit | Attending: Obstetrics & Gynecology | Admitting: Obstetrics & Gynecology

## 2014-11-05 DIAGNOSIS — N6320 Unspecified lump in the left breast, unspecified quadrant: Secondary | ICD-10-CM

## 2014-11-10 ENCOUNTER — Other Ambulatory Visit: Payer: Self-pay

## 2014-11-10 ENCOUNTER — Other Ambulatory Visit: Payer: Self-pay | Admitting: Obstetrics & Gynecology

## 2014-11-10 ENCOUNTER — Ambulatory Visit
Admission: RE | Admit: 2014-11-10 | Discharge: 2014-11-10 | Disposition: A | Discharge status: Home / Self Care | Payer: BLUE CROSS/BLUE SHIELD | Source: Ambulatory Visit | Attending: Obstetrics & Gynecology | Admitting: Obstetrics & Gynecology

## 2014-11-10 DIAGNOSIS — N6320 Unspecified lump in the left breast, unspecified quadrant: Secondary | ICD-10-CM

## 2014-11-29 ENCOUNTER — Other Ambulatory Visit (HOSPITAL_COMMUNITY): Payer: Self-pay | Admitting: Obstetrics & Gynecology

## 2014-11-30 LAB — CYTOLOGY - PAP

## 2014-12-14 ENCOUNTER — Encounter: Payer: Self-pay | Admitting: Internal Medicine

## 2014-12-16 ENCOUNTER — Other Ambulatory Visit: Payer: Self-pay | Admitting: Obstetrics & Gynecology

## 2015-05-18 IMAGING — MG MM DIAG BREAST TOMO BILATERAL
6 of 10 series · 6 of 30 positions shown · non-contrast
Comparison: 11/16/2013 and earlier

CLINICAL DATA: Palpable abnormality in the left breast.

EXAM:
DIGITAL DIAGNOSTIC BILATERAL MAMMOGRAM WITH 3D TOMOSYNTHESIS WITH
CAD
ULTRASOUND LEFT BREAST

[L TAN]
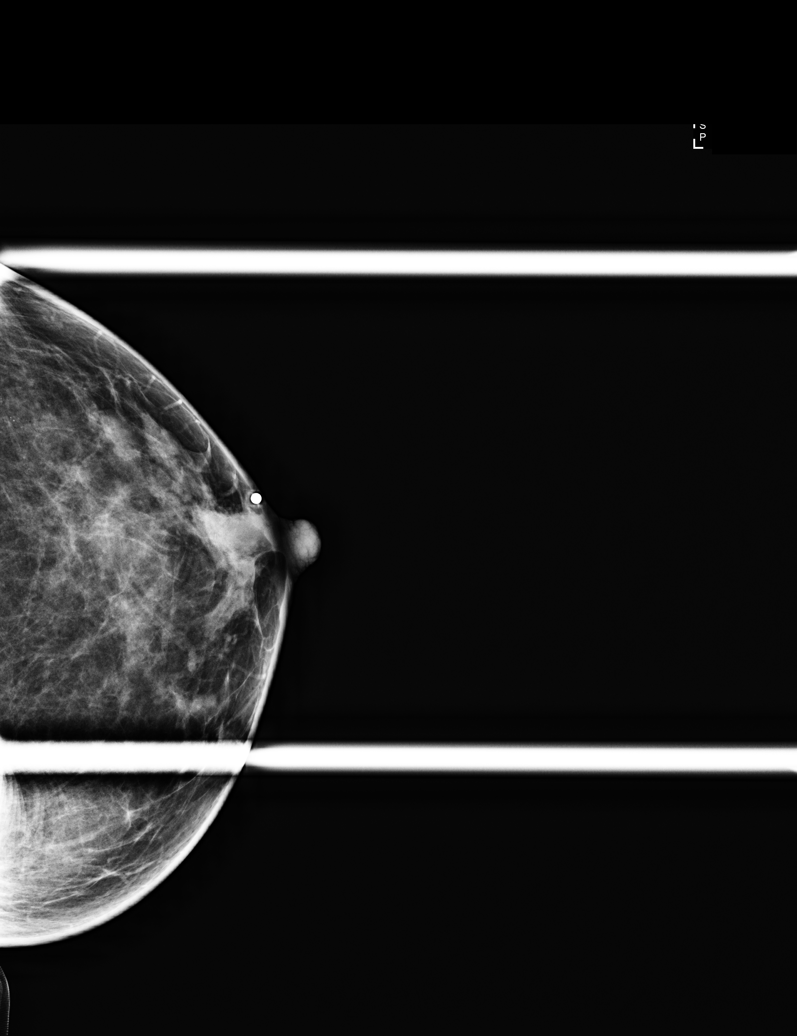

[R CC]
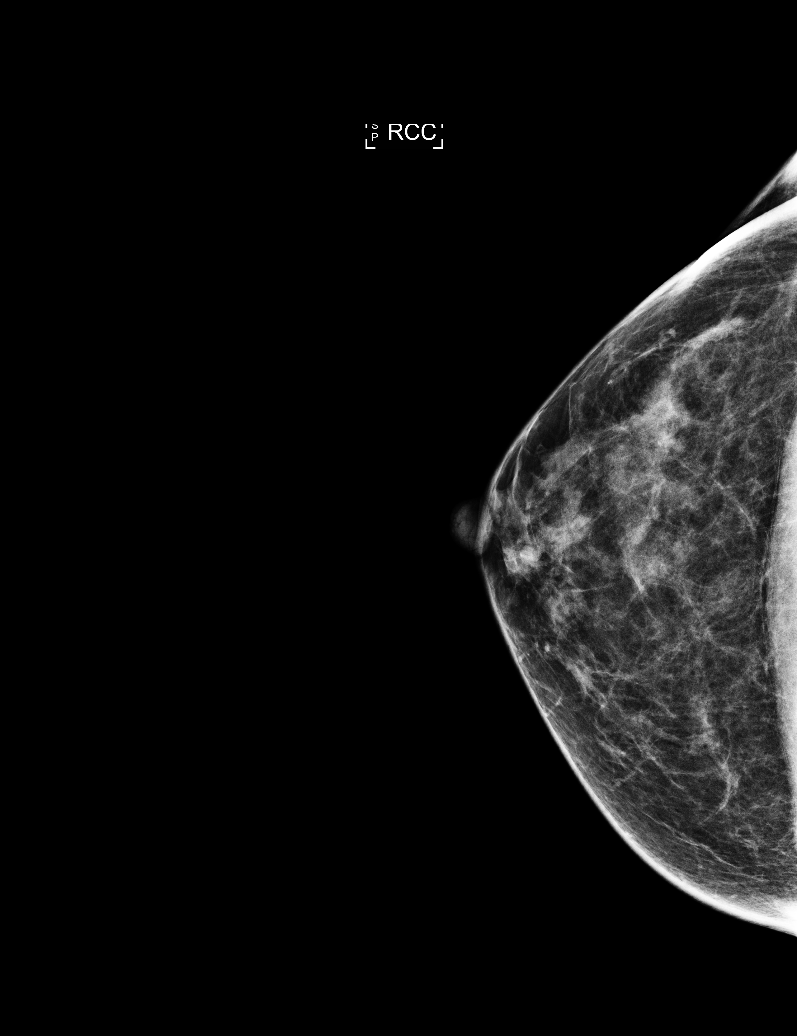

[R MLO]
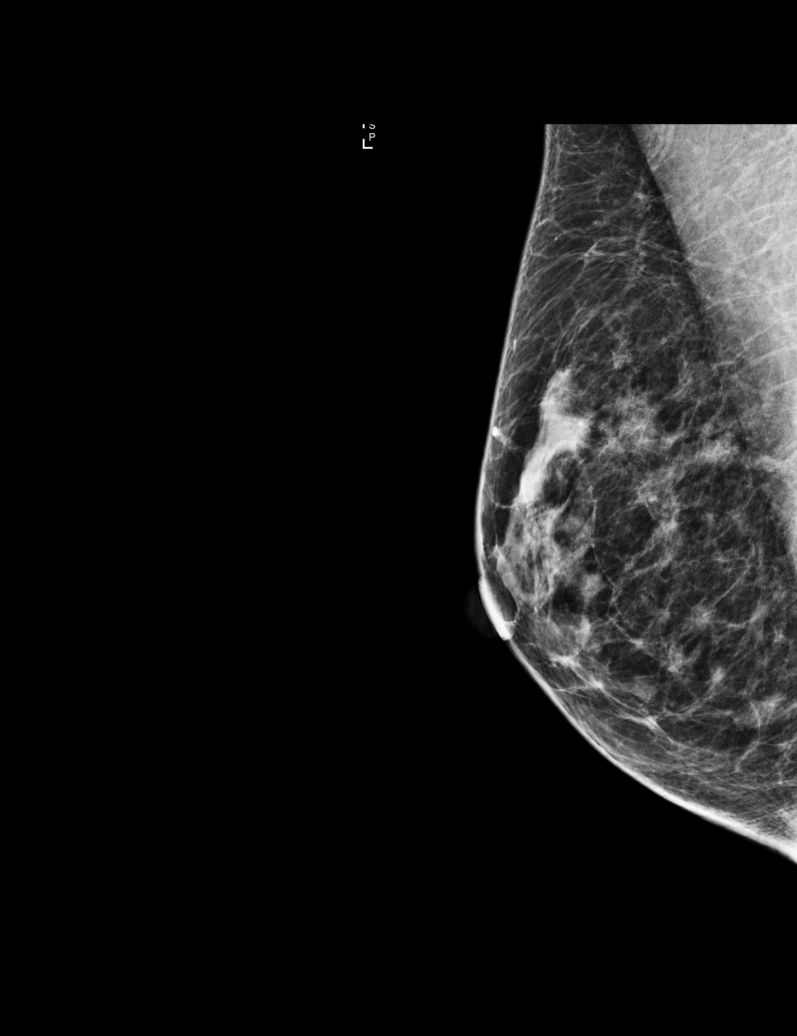

[L CC]
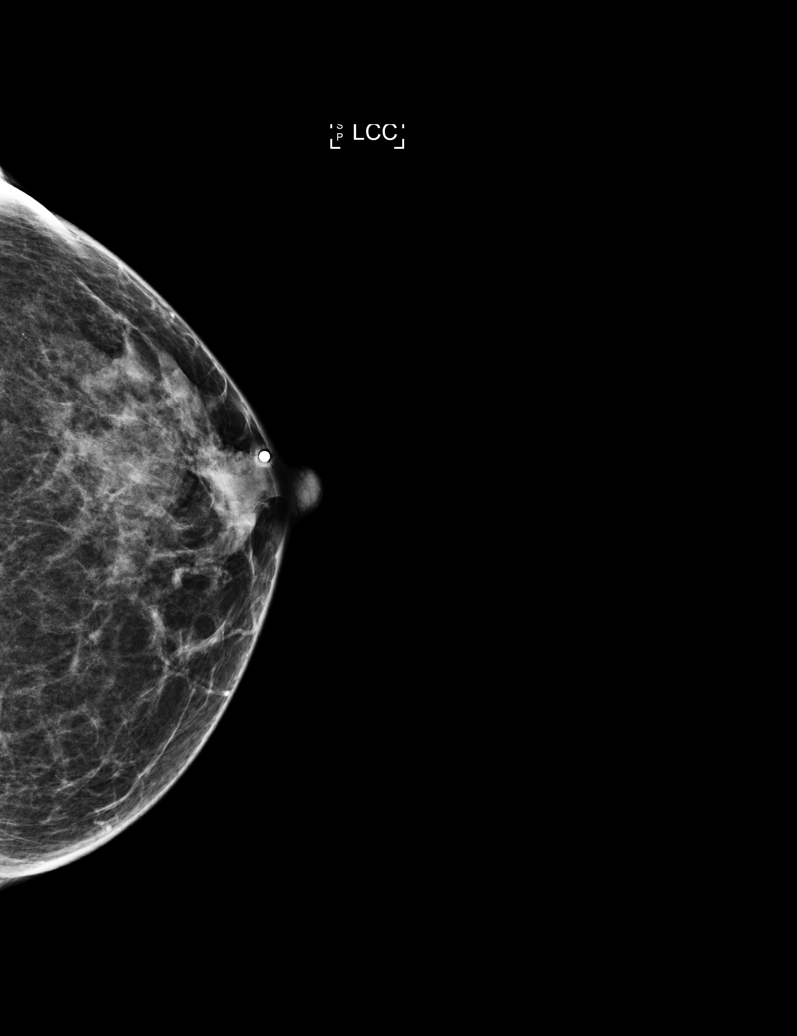

[L MLO]
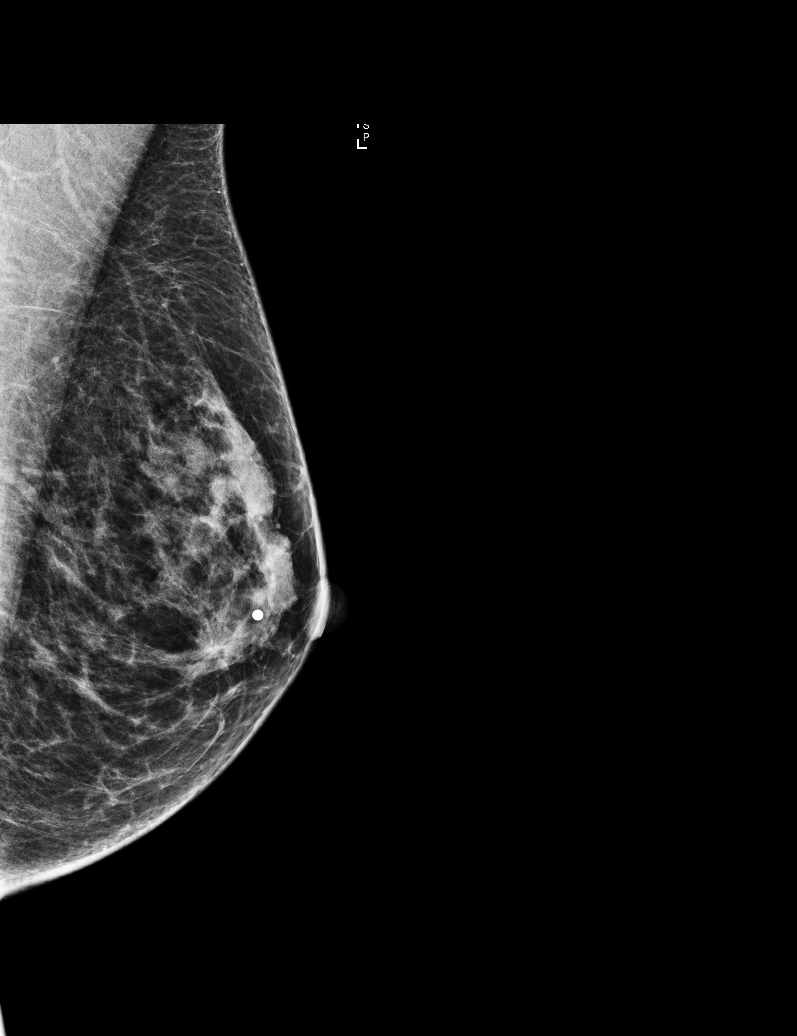

[R CC tomo · tomo slice 29/57.0]
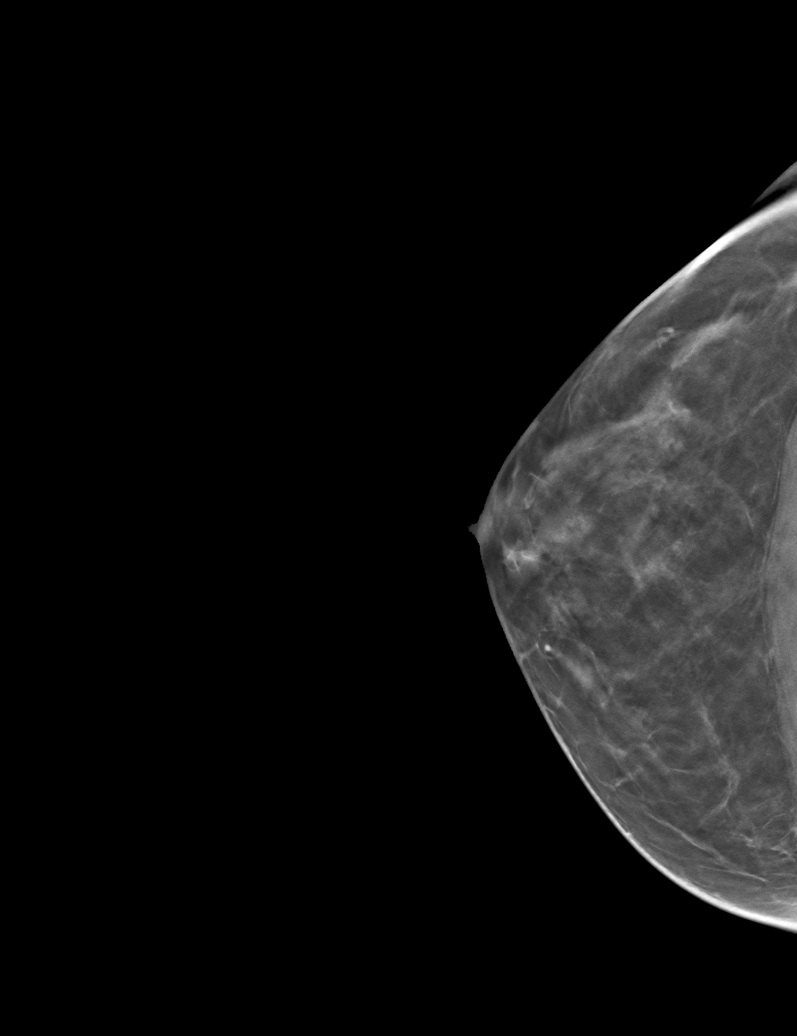

[6 of 30 positions shown; findings below may reference images not displayed]

ACR Breast Density Category c: The breast tissue is heterogeneously
dense, which may obscure small masses.
FINDINGS: There is asymmetric density in the retroareolar region of the left
breast corresponding to the area of patient' s concern. Images of
the right breast are negative.

Mammographic images were processed with CAD.

On physical exam, I palpate focal thickening in the 4 o'clock
retroareolar region of the left breast.

Targeted ultrasound is performed, showing a hypoechoic circumscribed
oval mass in the 4 o'clock retroareolar region of the left breast
which measures 0.7 x 0.7 x 0.4 cm. Within this mass there are non
mobile echogenic foci 9. No definite blood flow identified within
this mass on Doppler evaluation. Left axilla is negative for
adenopathy on ultrasound evaluation.
IMPRESSION: Palpable abnormality represents a solid or complex cystic mass in
the retroareolar region of the left breast. Tissue diagnosis is
recommended.

RECOMMENDATION:
Ultrasound-guided core biopsy is recommended and scheduled for the
patient on 11/10/2014 at 3 o'clock p.m..

I have discussed the findings and recommendations with the patient.
Results were also provided in writing at the conclusion of the
visit. If applicable, a reminder letter will be sent to the patient
regarding the next appointment.

BI-RADS CATEGORY  4: Suspicious.

## 2015-12-02 ENCOUNTER — Other Ambulatory Visit: Payer: Self-pay | Admitting: Obstetrics & Gynecology

## 2015-12-02 DIAGNOSIS — N63 Unspecified lump in unspecified breast: Secondary | ICD-10-CM

## 2015-12-07 ENCOUNTER — Ambulatory Visit
Admission: RE | Admit: 2015-12-07 | Discharge: 2015-12-07 | Disposition: A | Discharge status: Home / Self Care | Payer: BLUE CROSS/BLUE SHIELD | Source: Ambulatory Visit | Attending: Obstetrics & Gynecology | Admitting: Obstetrics & Gynecology

## 2015-12-07 ENCOUNTER — Other Ambulatory Visit: Payer: Self-pay | Admitting: Obstetrics & Gynecology

## 2015-12-07 DIAGNOSIS — N6310 Unspecified lump in the right breast, unspecified quadrant: Secondary | ICD-10-CM

## 2015-12-07 DIAGNOSIS — N63 Unspecified lump in unspecified breast: Secondary | ICD-10-CM

## 2016-12-07 ENCOUNTER — Other Ambulatory Visit: Payer: Self-pay | Admitting: Obstetrics & Gynecology

## 2016-12-07 DIAGNOSIS — N63 Unspecified lump in unspecified breast: Secondary | ICD-10-CM

## 2016-12-11 ENCOUNTER — Other Ambulatory Visit: Payer: BLUE CROSS/BLUE SHIELD

## 2016-12-12 ENCOUNTER — Other Ambulatory Visit: Payer: Self-pay | Admitting: Obstetrics & Gynecology

## 2016-12-12 ENCOUNTER — Ambulatory Visit
Admission: RE | Admit: 2016-12-12 | Discharge: 2016-12-12 | Disposition: A | Discharge status: Home / Self Care | Payer: BLUE CROSS/BLUE SHIELD | Source: Ambulatory Visit | Attending: Obstetrics & Gynecology | Admitting: Obstetrics & Gynecology

## 2016-12-12 DIAGNOSIS — N63 Unspecified lump in unspecified breast: Secondary | ICD-10-CM

## 2016-12-12 DIAGNOSIS — N6320 Unspecified lump in the left breast, unspecified quadrant: Secondary | ICD-10-CM

## 2016-12-19 ENCOUNTER — Other Ambulatory Visit: Payer: Self-pay | Admitting: Obstetrics & Gynecology

## 2016-12-19 DIAGNOSIS — N6320 Unspecified lump in the left breast, unspecified quadrant: Secondary | ICD-10-CM

## 2016-12-20 ENCOUNTER — Ambulatory Visit
Admission: RE | Admit: 2016-12-20 | Discharge: 2016-12-20 | Disposition: A | Discharge status: Home / Self Care | Payer: BLUE CROSS/BLUE SHIELD | Source: Ambulatory Visit | Attending: Obstetrics & Gynecology | Admitting: Obstetrics & Gynecology

## 2016-12-20 DIAGNOSIS — N6320 Unspecified lump in the left breast, unspecified quadrant: Secondary | ICD-10-CM

## 2017-05-24 ENCOUNTER — Other Ambulatory Visit: Payer: Self-pay | Admitting: Obstetrics & Gynecology

## 2017-05-24 DIAGNOSIS — Z139 Encounter for screening, unspecified: Secondary | ICD-10-CM

## 2017-05-24 DIAGNOSIS — Z9889 Other specified postprocedural states: Secondary | ICD-10-CM

## 2017-05-27 ENCOUNTER — Other Ambulatory Visit: Payer: BLUE CROSS/BLUE SHIELD

## 2017-05-31 ENCOUNTER — Ambulatory Visit
Admission: RE | Admit: 2017-05-31 | Discharge: 2017-05-31 | Disposition: A | Discharge status: Home / Self Care | Payer: BLUE CROSS/BLUE SHIELD | Source: Ambulatory Visit | Attending: Obstetrics & Gynecology | Admitting: Obstetrics & Gynecology

## 2017-05-31 DIAGNOSIS — Z9889 Other specified postprocedural states: Secondary | ICD-10-CM

## 2017-05-31 DIAGNOSIS — Z139 Encounter for screening, unspecified: Secondary | ICD-10-CM

## 2017-11-04 ENCOUNTER — Other Ambulatory Visit: Payer: Self-pay | Admitting: Obstetrics & Gynecology

## 2017-11-04 DIAGNOSIS — Z1231 Encounter for screening mammogram for malignant neoplasm of breast: Secondary | ICD-10-CM

## 2018-01-08 ENCOUNTER — Ambulatory Visit
Admission: RE | Admit: 2018-01-08 | Discharge: 2018-01-08 | Disposition: A | Discharge status: Home / Self Care | Payer: BLUE CROSS/BLUE SHIELD | Source: Ambulatory Visit | Attending: Obstetrics & Gynecology | Admitting: Obstetrics & Gynecology

## 2018-01-08 DIAGNOSIS — Z1231 Encounter for screening mammogram for malignant neoplasm of breast: Secondary | ICD-10-CM

## 2018-11-28 ENCOUNTER — Encounter: Payer: Self-pay | Admitting: Gastroenterology

## 2018-12-03 ENCOUNTER — Other Ambulatory Visit: Payer: Self-pay | Admitting: Obstetrics & Gynecology

## 2018-12-03 DIAGNOSIS — Z1231 Encounter for screening mammogram for malignant neoplasm of breast: Secondary | ICD-10-CM

## 2019-01-21 ENCOUNTER — Ambulatory Visit
Admission: RE | Admit: 2019-01-21 | Discharge: 2019-01-21 | Disposition: A | Discharge status: Home / Self Care | Payer: BC Managed Care – PPO | Source: Ambulatory Visit | Attending: Obstetrics & Gynecology | Admitting: Obstetrics & Gynecology

## 2019-01-21 ENCOUNTER — Other Ambulatory Visit: Payer: Self-pay

## 2019-01-21 DIAGNOSIS — Z1231 Encounter for screening mammogram for malignant neoplasm of breast: Secondary | ICD-10-CM

## 2019-12-14 ENCOUNTER — Other Ambulatory Visit: Payer: Self-pay | Admitting: Obstetrics & Gynecology

## 2019-12-14 DIAGNOSIS — Z1231 Encounter for screening mammogram for malignant neoplasm of breast: Secondary | ICD-10-CM

## 2020-02-01 ENCOUNTER — Ambulatory Visit
Admission: RE | Admit: 2020-02-01 | Discharge: 2020-02-01 | Disposition: A | Discharge status: Home / Self Care | Payer: BC Managed Care – PPO | Source: Ambulatory Visit | Attending: Obstetrics & Gynecology | Admitting: Obstetrics & Gynecology

## 2020-02-01 ENCOUNTER — Other Ambulatory Visit: Payer: Self-pay

## 2020-02-01 DIAGNOSIS — Z1231 Encounter for screening mammogram for malignant neoplasm of breast: Secondary | ICD-10-CM

## 2020-05-11 ENCOUNTER — Telehealth: Payer: Self-pay | Admitting: Gastroenterology

## 2020-05-11 NOTE — Telephone Encounter (Signed)
Pt has a recall colonoscopy for Dr Loletha Carrow 10/2020, former Dr Olevia Perches pt, pt wouild like to know if she could do the colonoscopy prior to the end of this year since she still has insurance.

## 2020-05-11 NOTE — Telephone Encounter (Signed)
Dr. Loletha Carrow,  Please advise. Thanks.

## 2020-05-12 NOTE — Telephone Encounter (Signed)
Two adenomatous polyps removed in April 2015.  It has been 6 and a half years, so fine to have a colonoscopy by the end of this year if she would like.  - HD

## 2020-05-12 NOTE — Telephone Encounter (Signed)
Dr. Carlean Purl, just an FYI.  Spoke with patient, former Dr. Olevia Perches patient, has not been seen in the office. States that GYN recommended Dr. Carlean Purl. Patient wanting to schedule procedure with Dr. Carlean Purl.  Patient is scheduled for a pre-visit on 06/10/20 at 10:30 AM. Patient is scheduled for a colon on 06/23/20 at 11 AM with a 10 AM arrival time.

## 2020-05-13 NOTE — Telephone Encounter (Signed)
Thanks

## 2020-06-10 ENCOUNTER — Ambulatory Visit (AMBULATORY_SURGERY_CENTER): Payer: Self-pay

## 2020-06-10 ENCOUNTER — Other Ambulatory Visit: Payer: Self-pay

## 2020-06-10 VITALS — Ht 64.5 in | Wt 160.0 lb

## 2020-06-10 DIAGNOSIS — Z86010 Personal history of colonic polyps: Secondary | ICD-10-CM

## 2020-06-10 MED ORDER — SUTAB 1479-225-188 MG PO TABS: 12.0000 | ORAL_TABLET | ORAL | 0 refills | Status: DC

## 2020-06-10 NOTE — Progress Notes (Signed)
No allergies to soy or egg Pt is not on blood thinners or diet pills Denies issues with sedation/intubation Denies atrial flutter/fib Has issues with constipation-changed prep to a 2-day sutab after discussion.  Pt is aware of Covid safety and care partner requirements.  Assessed for PVCs about 6 yrs ago--no issues found at the time.

## 2020-06-13 ENCOUNTER — Encounter: Payer: Self-pay | Admitting: Internal Medicine

## 2020-06-23 ENCOUNTER — Other Ambulatory Visit: Payer: Self-pay

## 2020-06-23 ENCOUNTER — Encounter: Payer: Self-pay | Admitting: Internal Medicine

## 2020-06-23 ENCOUNTER — Ambulatory Visit (AMBULATORY_SURGERY_CENTER): Payer: BC Managed Care – PPO | Admitting: Internal Medicine

## 2020-06-23 VITALS — BP 126/73 | HR 46 | Temp 97.1°F | Resp 18 | Ht 64.5 in | Wt 160.0 lb

## 2020-06-23 DIAGNOSIS — Z860100 Personal history of colon polyps, unspecified: Secondary | ICD-10-CM

## 2020-06-23 DIAGNOSIS — D123 Benign neoplasm of transverse colon: Secondary | ICD-10-CM

## 2020-06-23 DIAGNOSIS — D122 Benign neoplasm of ascending colon: Secondary | ICD-10-CM | POA: Diagnosis not present

## 2020-06-23 MED ORDER — SODIUM CHLORIDE 0.9 % IV SOLN: 500.0000 mL | Freq: Once | INTRAVENOUS | Status: DC

## 2020-06-23 NOTE — Progress Notes (Signed)
pt tolerated well. VSS. awake and to recovery. Report given to RN.  

## 2020-06-23 NOTE — Patient Instructions (Addendum)
I found and removed 2 tiny polyps that I am certain are benign but I will prove that because the pathologist will analyzed them.  They could be precancerous like the polyps that were removed the last time.  Do not worry about that that is common and we take those out so they cannot turn into cancer.  All else looked okay.  I appreciate the opportunity to care for you. Gatha Mayer, MD, FACG   YOU HAD AN ENDOSCOPIC PROCEDURE TODAY AT [facility]:   Refer to the procedure report that was given to you for any specific questions about what was found during the examination.  If the procedure report does not answer your questions, please call your gastroenterologist to clarify.  If you requested that your care partner not be given the details of your procedure findings, then the procedure report has been included in a sealed envelope for you to review at your convenience later.  YOU SHOULD EXPECT: Some feelings of bloating in the abdomen. Passage of more gas than usual.  Walking can help get rid of the air that was put into your GI tract during the procedure and reduce the bloating. If you had a lower endoscopy (such as a colonoscopy or flexible sigmoidoscopy) you may notice spotting of blood in your stool or on the toilet paper. If you underwent a bowel prep for your procedure, you may not have a normal bowel movement for a few days.  Please Note:  You might notice some irritation and congestion in your nose or some drainage.  This is from the oxygen used during your procedure.  There is no need for concern and it should clear up in a day or so.  SYMPTOMS TO REPORT IMMEDIATELY:   Following lower endoscopy (colonoscopy or flexible sigmoidoscopy):  Excessive amounts of blood in the stool  Significant tenderness or worsening of abdominal pains  Swelling of the abdomen that is new, acute  Fever of 100F or higher  For urgent or emergent issues, a gastroenterologist can be reached at  any hour by calling [phone]. Do not use MyChart messaging for urgent concerns.    DIET:  We do recommend a small meal at first, but then you may proceed to your regular diet.  Drink plenty of fluids but you should avoid alcoholic beverages for 24 hours.  ACTIVITY:  You should plan to take it easy for the rest of today and you should NOT DRIVE or use heavy machinery until tomorrow (because of the sedation medicines used during the test).    FOLLOW UP: Our staff will call the number listed on your records 48-72 hours following your procedure to check on you and address any questions or concerns that you may have regarding the information given to you following your procedure. If we do not reach you, we will leave a message.  We will attempt to reach you two times.  During this call, we will ask if you have developed any symptoms of COVID 19. If you develop any symptoms (ie: fever, flu-like symptoms, shortness of breath, cough etc.) before then, please call [phone].  If you test positive for Covid 19 in the 2 weeks post procedure, please call and report this information to [place].    If any biopsies were taken you will be contacted by phone or by letter within the next 1-3 weeks.  Please call us at [phone] if you have not heard about the biopsies in 3 weeks.  SIGNATURES/CONFIDENTIALITY: You and/or your care partner have signed paperwork which will be entered into your electronic medical record.  These signatures attest to the fact that that the information above on your After Visit Summary has been reviewed and is understood.  Full responsibility of the confidentiality of this discharge information lies with you and/or your care-partner.

## 2020-06-23 NOTE — Op Note (Signed)
[facility] Patient Name: Prim Morace Procedure Date: 06/23/2020 11:42 AM MRN: [id number] Endoscopist: Gatha Mayer , MD Age: 59 Referring MD:  Date of Birth: [birth year] Gender: Female Account #: [id number] Procedure:                Colonoscopy Indications:              Surveillance: Personal history of adenomatous                            polyps on last colonoscopy > 5 years ago Medicines:                Propofol per Anesthesia, Monitored Anesthesia Care Procedure:                Pre-Anesthesia Assessment:                           - Prior to the procedure, a History and Physical                            was performed, and patient medications and                            allergies were reviewed. The patient's tolerance of                            previous anesthesia was also reviewed. The risks                            and benefits of the procedure and the sedation                            options and risks were discussed with the patient.                            All questions were answered, and informed consent                            was obtained. Prior Anticoagulants: The patient has                            taken no previous anticoagulant or antiplatelet                            agents. ASA Grade Assessment: II - A patient with                            mild systemic disease. After reviewing the risks                            and benefits, the patient was deemed in                            satisfactory condition to undergo the procedure.  After obtaining informed consent, the colonoscope                            was passed under direct vision. Throughout the                            procedure, the patient's blood pressure, pulse, and                            oxygen saturations were monitored continuously. The                            Colonoscope was introduced through the anus and                             advanced to the the cecum, identified by                            appendiceal orifice and ileocecal valve. The                            colonoscopy was somewhat difficult due to a                            redundant colon and significant looping. Successful                            completion of the procedure was aided by using                            manual pressure and straightening and shortening                            the scope to obtain bowel loop reduction. The                            patient tolerated the procedure well. The quality                            of the bowel preparation was good. The bowel                            preparation used was Miralax ands SuTab via split                            dose instruction. The ileocecal valve, appendiceal                            orifice, and rectum were photographed. Scope In: 11:54:05 AM Scope Out: 12:17:16 PM Scope Withdrawal Time: 0 hours 11 minutes 51 seconds  Total Procedure Duration: 0 hours 23 minutes 11 seconds  Findings:                 The perianal and digital rectal examinations were  normal.                           Two sessile polyps were found in the transverse                            colon and ascending colon. The polyps were                            diminutive in size. These polyps were removed with                            a cold snare. Resection and retrieval were                            complete. Verification of patient identification                            for the specimen was done. Estimated blood loss was                            minimal.                           The exam was otherwise without abnormality on                            direct and retroflexion views. Complications:            No immediate complications. Estimated Blood Loss:     Estimated blood loss was minimal. Impression:               - Two diminutive polyps in the transverse  colon and                            in the ascending colon, removed with a cold snare.                            Resected and retrieved.                           - The examination was otherwise normal on direct                            and retroflexion views.                           - Personal history of colonic polyps. 2 subcm                            adenomas 2015 Recommendation:           - Patient has a contact number available for                            emergencies. The signs and symptoms of potential  delayed complications were discussed with the                            patient. Return to normal activities tomorrow.                            Written discharge instructions were provided to the                            patient.                           - Resume previous diet.                           - Continue present medications.                           - Repeat colonoscopy is recommended for                            surveillance. The colonoscopy date will be                            determined after pathology results from today's                            exam become available for review. Gatha Mayer, MD 06/23/2020 12:25:22 PM This report has been signed electronically.

## 2020-06-23 NOTE — Progress Notes (Signed)
Pt's states no medical or surgical changes since previsit or office visit.  VS SP

## 2020-06-27 ENCOUNTER — Telehealth: Payer: Self-pay | Admitting: *Deleted

## 2020-06-27 ENCOUNTER — Telehealth: Payer: Self-pay

## 2020-06-27 NOTE — Telephone Encounter (Signed)
First attempt, left VM.  

## 2020-06-27 NOTE — Telephone Encounter (Signed)
  Follow up Call-  Call back number 06/23/2020  Post procedure Call Back phone  # [phone]  Permission to leave phone message Yes  Some recent data might be hidden     Patient questions:  Do you have a fever, pain , or abdominal swelling? No. Pain Score  0 *  Have you tolerated food without any problems? Yes.    Have you been able to return to your normal activities? Yes.    Do you have any questions about your discharge instructions: Diet   No. Medications  No. Follow up visit  No.  Do you have questions or concerns about your Care? No.  Actions: * If pain score is 4 or above: No action needed, pain <4.  1. Have you developed a fever since your procedure? No  2.   Have you had an respiratory symptoms (SOB or cough) since your procedure? No  3.   Have you tested positive for COVID 19 since your procedure No  4.   Have you had any family members/close contacts diagnosed with the COVID 19 since your procedure?  No   If yes to any of these questions please route to Joylene John, RN and Joella Prince, RN

## 2020-07-05 ENCOUNTER — Encounter: Payer: Self-pay | Admitting: Internal Medicine

## 2020-12-27 ENCOUNTER — Other Ambulatory Visit: Payer: Self-pay | Admitting: Obstetrics & Gynecology

## 2020-12-27 DIAGNOSIS — Z1231 Encounter for screening mammogram for malignant neoplasm of breast: Secondary | ICD-10-CM

## 2021-03-02 ENCOUNTER — Other Ambulatory Visit: Payer: Self-pay

## 2021-03-02 ENCOUNTER — Ambulatory Visit
Admission: RE | Admit: 2021-03-02 | Discharge: 2021-03-02 | Disposition: A | Discharge status: Home / Self Care | Payer: PRIVATE HEALTH INSURANCE | Source: Ambulatory Visit | Attending: Obstetrics & Gynecology | Admitting: Obstetrics & Gynecology

## 2021-03-02 DIAGNOSIS — Z1231 Encounter for screening mammogram for malignant neoplasm of breast: Secondary | ICD-10-CM

## 2021-03-06 ENCOUNTER — Other Ambulatory Visit: Payer: Self-pay | Admitting: Obstetrics & Gynecology

## 2021-03-06 DIAGNOSIS — R928 Other abnormal and inconclusive findings on diagnostic imaging of breast: Secondary | ICD-10-CM

## 2021-03-20 ENCOUNTER — Ambulatory Visit
Admission: RE | Admit: 2021-03-20 | Discharge: 2021-03-20 | Disposition: A | Discharge status: Home / Self Care | Payer: PRIVATE HEALTH INSURANCE | Source: Ambulatory Visit | Attending: Obstetrics & Gynecology | Admitting: Obstetrics & Gynecology

## 2021-03-20 ENCOUNTER — Other Ambulatory Visit: Payer: Self-pay

## 2021-03-20 ENCOUNTER — Ambulatory Visit
Admission: RE | Admit: 2021-03-20 | Discharge: 2021-03-20 | Disposition: A | Discharge status: Home / Self Care | Payer: BC Managed Care – PPO | Source: Ambulatory Visit | Attending: Obstetrics & Gynecology | Admitting: Obstetrics & Gynecology

## 2021-03-20 DIAGNOSIS — R928 Other abnormal and inconclusive findings on diagnostic imaging of breast: Secondary | ICD-10-CM

## 2022-06-25 ENCOUNTER — Other Ambulatory Visit: Payer: Self-pay | Admitting: Internal Medicine

## 2022-06-25 DIAGNOSIS — E785 Hyperlipidemia, unspecified: Secondary | ICD-10-CM

## 2022-07-27 ENCOUNTER — Ambulatory Visit
Admission: RE | Admit: 2022-07-27 | Discharge: 2022-07-27 | Disposition: A | Discharge status: Home / Self Care | Payer: No Typology Code available for payment source | Source: Ambulatory Visit | Attending: Internal Medicine

## 2022-07-27 DIAGNOSIS — E785 Hyperlipidemia, unspecified: Secondary | ICD-10-CM

## 2023-09-18 DIAGNOSIS — R7303 Prediabetes: Secondary | ICD-10-CM | POA: Diagnosis not present

## 2023-09-18 DIAGNOSIS — E785 Hyperlipidemia, unspecified: Secondary | ICD-10-CM | POA: Diagnosis not present

## 2023-09-18 DIAGNOSIS — Z1389 Encounter for screening for other disorder: Secondary | ICD-10-CM | POA: Diagnosis not present

## 2023-09-25 DIAGNOSIS — Z0000 Encounter for general adult medical examination without abnormal findings: Secondary | ICD-10-CM | POA: Diagnosis not present

## 2023-09-25 DIAGNOSIS — Z23 Encounter for immunization: Secondary | ICD-10-CM | POA: Diagnosis not present

## 2023-09-25 DIAGNOSIS — M4802 Spinal stenosis, cervical region: Secondary | ICD-10-CM | POA: Diagnosis not present

## 2023-09-25 DIAGNOSIS — E785 Hyperlipidemia, unspecified: Secondary | ICD-10-CM | POA: Diagnosis not present

## 2023-09-25 DIAGNOSIS — M720 Palmar fascial fibromatosis [Dupuytren]: Secondary | ICD-10-CM | POA: Diagnosis not present

## 2023-09-25 DIAGNOSIS — R7303 Prediabetes: Secondary | ICD-10-CM | POA: Diagnosis not present

## 2023-09-25 DIAGNOSIS — G479 Sleep disorder, unspecified: Secondary | ICD-10-CM | POA: Diagnosis not present

## 2023-09-25 DIAGNOSIS — I700 Atherosclerosis of aorta: Secondary | ICD-10-CM | POA: Diagnosis not present

## 2023-09-25 DIAGNOSIS — Z85828 Personal history of other malignant neoplasm of skin: Secondary | ICD-10-CM | POA: Diagnosis not present

## 2023-09-25 DIAGNOSIS — M7591 Shoulder lesion, unspecified, right shoulder: Secondary | ICD-10-CM | POA: Diagnosis not present

## 2023-10-21 DIAGNOSIS — M720 Palmar fascial fibromatosis [Dupuytren]: Secondary | ICD-10-CM | POA: Diagnosis not present

## 2023-10-21 DIAGNOSIS — M25511 Pain in right shoulder: Secondary | ICD-10-CM | POA: Diagnosis not present

## 2023-10-21 DIAGNOSIS — G8929 Other chronic pain: Secondary | ICD-10-CM | POA: Diagnosis not present

## 2023-10-21 DIAGNOSIS — G5601 Carpal tunnel syndrome, right upper limb: Secondary | ICD-10-CM | POA: Diagnosis not present

## 2023-10-28 DIAGNOSIS — M25511 Pain in right shoulder: Secondary | ICD-10-CM | POA: Diagnosis not present

## 2023-10-28 DIAGNOSIS — M542 Cervicalgia: Secondary | ICD-10-CM | POA: Diagnosis not present

## 2023-11-04 DIAGNOSIS — M542 Cervicalgia: Secondary | ICD-10-CM | POA: Diagnosis not present

## 2023-11-04 DIAGNOSIS — M25511 Pain in right shoulder: Secondary | ICD-10-CM | POA: Diagnosis not present

## 2023-11-08 DIAGNOSIS — M25511 Pain in right shoulder: Secondary | ICD-10-CM | POA: Diagnosis not present

## 2023-11-08 DIAGNOSIS — M542 Cervicalgia: Secondary | ICD-10-CM | POA: Diagnosis not present

## 2023-11-11 DIAGNOSIS — M542 Cervicalgia: Secondary | ICD-10-CM | POA: Diagnosis not present

## 2023-11-11 DIAGNOSIS — M25511 Pain in right shoulder: Secondary | ICD-10-CM | POA: Diagnosis not present

## 2023-11-25 DIAGNOSIS — M542 Cervicalgia: Secondary | ICD-10-CM | POA: Diagnosis not present

## 2023-11-25 DIAGNOSIS — M25511 Pain in right shoulder: Secondary | ICD-10-CM | POA: Diagnosis not present

## 2023-11-28 DIAGNOSIS — M542 Cervicalgia: Secondary | ICD-10-CM | POA: Diagnosis not present

## 2023-11-28 DIAGNOSIS — M25511 Pain in right shoulder: Secondary | ICD-10-CM | POA: Diagnosis not present

## 2023-12-02 DIAGNOSIS — M720 Palmar fascial fibromatosis [Dupuytren]: Secondary | ICD-10-CM | POA: Diagnosis not present

## 2023-12-02 DIAGNOSIS — G8929 Other chronic pain: Secondary | ICD-10-CM | POA: Diagnosis not present

## 2023-12-02 DIAGNOSIS — M25511 Pain in right shoulder: Secondary | ICD-10-CM | POA: Diagnosis not present

## 2023-12-02 DIAGNOSIS — G5601 Carpal tunnel syndrome, right upper limb: Secondary | ICD-10-CM | POA: Diagnosis not present

## 2023-12-19 DIAGNOSIS — M25511 Pain in right shoulder: Secondary | ICD-10-CM | POA: Diagnosis not present

## 2023-12-19 DIAGNOSIS — M542 Cervicalgia: Secondary | ICD-10-CM | POA: Diagnosis not present

## 2023-12-25 DIAGNOSIS — M25511 Pain in right shoulder: Secondary | ICD-10-CM | POA: Diagnosis not present

## 2023-12-25 DIAGNOSIS — M542 Cervicalgia: Secondary | ICD-10-CM | POA: Diagnosis not present

## 2024-01-02 DIAGNOSIS — M25511 Pain in right shoulder: Secondary | ICD-10-CM | POA: Diagnosis not present

## 2024-01-02 DIAGNOSIS — M542 Cervicalgia: Secondary | ICD-10-CM | POA: Diagnosis not present

## 2024-01-16 DIAGNOSIS — M25511 Pain in right shoulder: Secondary | ICD-10-CM | POA: Diagnosis not present

## 2024-01-16 DIAGNOSIS — M542 Cervicalgia: Secondary | ICD-10-CM | POA: Diagnosis not present

## 2024-01-17 DIAGNOSIS — M720 Palmar fascial fibromatosis [Dupuytren]: Secondary | ICD-10-CM | POA: Diagnosis not present

## 2024-01-17 DIAGNOSIS — G8929 Other chronic pain: Secondary | ICD-10-CM | POA: Diagnosis not present

## 2024-01-17 DIAGNOSIS — M25511 Pain in right shoulder: Secondary | ICD-10-CM | POA: Diagnosis not present

## 2024-01-22 DIAGNOSIS — M25511 Pain in right shoulder: Secondary | ICD-10-CM | POA: Diagnosis not present

## 2024-01-22 DIAGNOSIS — M542 Cervicalgia: Secondary | ICD-10-CM | POA: Diagnosis not present

## 2024-04-06 DIAGNOSIS — Q143 Congenital malformation of choroid: Secondary | ICD-10-CM | POA: Diagnosis not present

## 2024-04-08 DIAGNOSIS — D2222 Melanocytic nevi of left ear and external auricular canal: Secondary | ICD-10-CM | POA: Diagnosis not present

## 2024-04-08 DIAGNOSIS — D2271 Melanocytic nevi of right lower limb, including hip: Secondary | ICD-10-CM | POA: Diagnosis not present

## 2024-04-08 DIAGNOSIS — L821 Other seborrheic keratosis: Secondary | ICD-10-CM | POA: Diagnosis not present

## 2024-04-08 DIAGNOSIS — Z85828 Personal history of other malignant neoplasm of skin: Secondary | ICD-10-CM | POA: Diagnosis not present

## 2024-04-08 DIAGNOSIS — L820 Inflamed seborrheic keratosis: Secondary | ICD-10-CM | POA: Diagnosis not present

## 2024-04-08 DIAGNOSIS — L814 Other melanin hyperpigmentation: Secondary | ICD-10-CM | POA: Diagnosis not present

## 2024-04-08 DIAGNOSIS — D1801 Hemangioma of skin and subcutaneous tissue: Secondary | ICD-10-CM | POA: Diagnosis not present

## 2024-04-08 DIAGNOSIS — D225 Melanocytic nevi of trunk: Secondary | ICD-10-CM | POA: Diagnosis not present

## 2024-04-08 DIAGNOSIS — D224 Melanocytic nevi of scalp and neck: Secondary | ICD-10-CM | POA: Diagnosis not present

## 2024-05-06 DIAGNOSIS — Z6827 Body mass index (BMI) 27.0-27.9, adult: Secondary | ICD-10-CM | POA: Diagnosis not present

## 2024-05-06 DIAGNOSIS — Z1231 Encounter for screening mammogram for malignant neoplasm of breast: Secondary | ICD-10-CM | POA: Diagnosis not present

## 2024-05-06 DIAGNOSIS — Z01419 Encounter for gynecological examination (general) (routine) without abnormal findings: Secondary | ICD-10-CM | POA: Diagnosis not present
